# Patient Record
Sex: Male | Born: 1935 | Race: Black or African American | Hispanic: No | Smoking: Current some day smoker
Health system: Southern US, Community
[De-identification: ages and names within clinical notes are randomized; demographics above are authoritative.]

## PROBLEM LIST (undated history)

## (undated) DIAGNOSIS — I639 Cerebral infarction, unspecified: Secondary | ICD-10-CM

## (undated) DIAGNOSIS — I10 Essential (primary) hypertension: Secondary | ICD-10-CM

## (undated) DIAGNOSIS — R413 Other amnesia: Secondary | ICD-10-CM

## (undated) HISTORY — PX: ANKLE SURGERY: SHX546

## (undated) HISTORY — DX: Essential (primary) hypertension: I10

## (undated) HISTORY — DX: Other amnesia: R41.3

## (undated) HISTORY — PX: KNEE SURGERY: SHX244

## (undated) HISTORY — DX: Cerebral infarction, unspecified: I63.9

---

## 2006-04-09 ENCOUNTER — Inpatient Hospital Stay (HOSPITAL_COMMUNITY): Admission: EM | Admit: 2006-04-09 | Discharge: 2006-04-13 | Payer: Self-pay | Admitting: Emergency Medicine

## 2006-04-12 ENCOUNTER — Ambulatory Visit: Payer: Self-pay | Admitting: Physical Medicine & Rehabilitation

## 2006-05-10 ENCOUNTER — Ambulatory Visit (HOSPITAL_COMMUNITY): Admission: RE | Admit: 2006-05-10 | Discharge: 2006-05-10 | Payer: Self-pay | Admitting: Internal Medicine

## 2008-03-02 IMAGING — CR DG CHEST 1V
1 series · 1 of 1 positions shown · non-contrast
Comparison: None.

CLINICAL DATA: 79-year-old with right-side weakness.  
 CHEST - 1 VIEW:

[view not recorded]
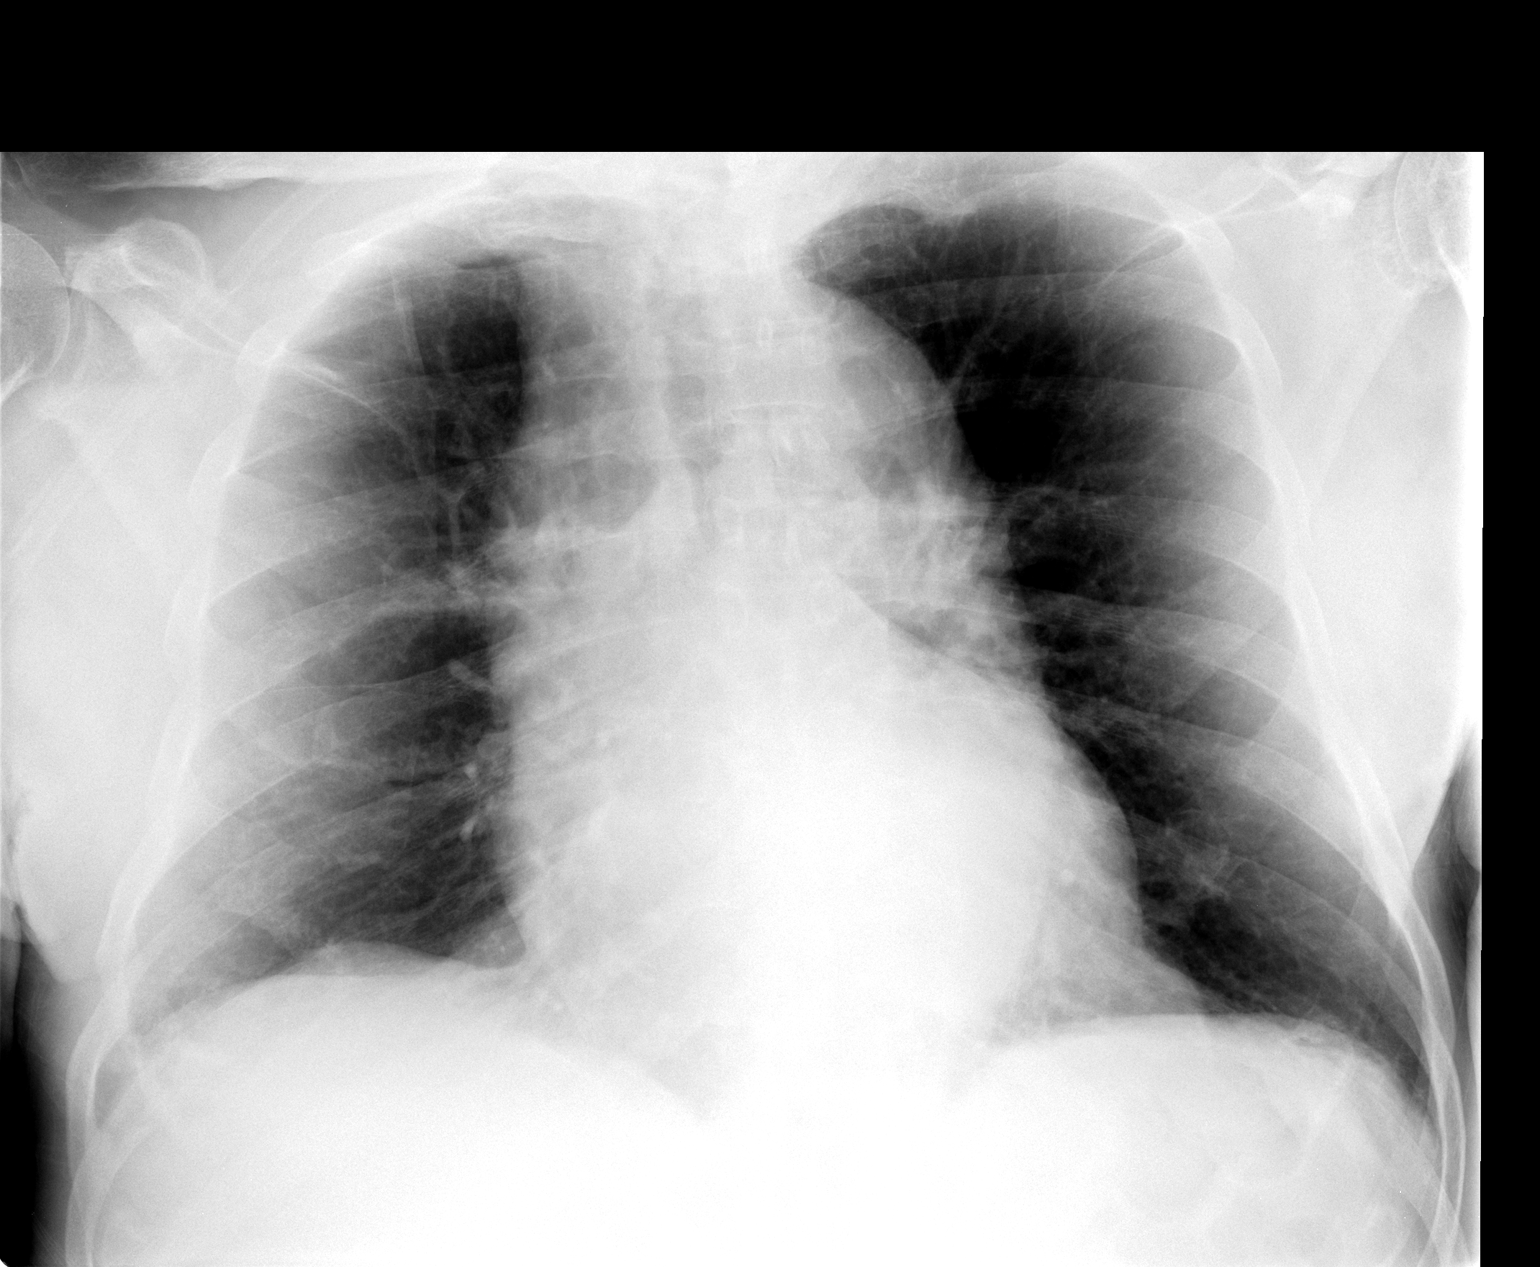

[1 of 1 positions shown; findings below may reference images not displayed]

Patient is rotated to the right, accentuating the apparent width of the mediastinum.  The aorta does appear to be ectatic.  Heart size is normal.  No focal consolidations or pleural effusions are identified.  No evidence for pulmonary edema.
IMPRESSION: No evidence for acute cardiopulmonary disease.

## 2008-03-03 IMAGING — XA IR ANGIO/CAROTID/CERV BI
1 series · 15 of 24 positions shown · IV contrast (omnipaque)
Comparison: none

CLINICAL DATA: Patient with right hemipareses with left deep cerebral hemispheric ischemic stroke.  MRA suggestive of severe stenosis of the basilar artery. 
FOUR-VESSEL CEREBRAL ARTERIOGRAM: 
Following the full explanation of the procedure, along with the potentially associated complications, an informed witnessed consent was obtained. 
The right groin was prepped and draped in the usual fashion.  Thereafter, using a modified Seldinger technique, transfemoral access into the right common femoral artery was obtained without difficulty.  Over a 0.035-inch guidewire, a 5-French Pinnacle sheath was inserted.  Through this and also over a 0.035-inch guidewire, a 5-French JB-1 catheter was advanced through the aortic arch region and selectively positioned in the right common carotid artery, the right vertebral artery, the left common carotid artery, and the left vertebral artery. There were no acute complications.  The patient tolerated the procedure well. 
Medications utilized:  Versed 1 mg IV, fentanyl 50 mg IV. 
Contrast:  Omnipaque 300, approximately 60 cc.

[Series 1: run · 15 of 234 slices shown]
[im 1/234]
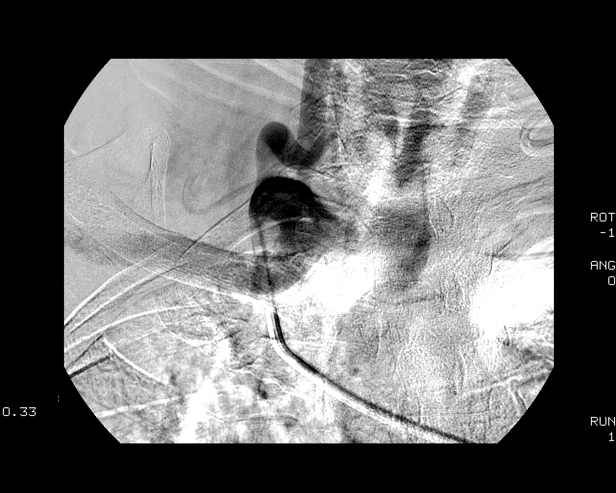
[im 21/234]
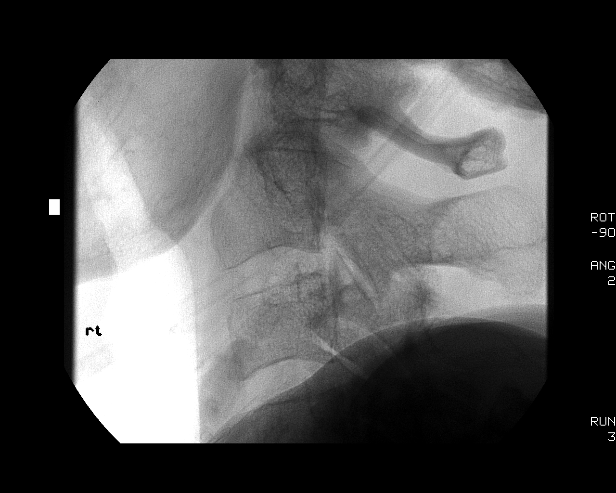
[im 41/234]
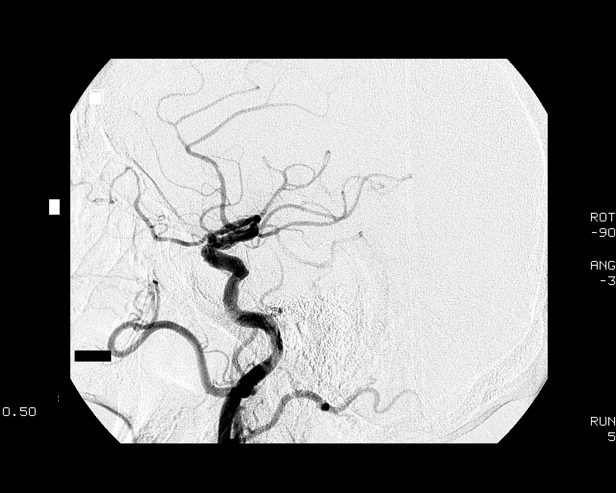
[im 51/234]
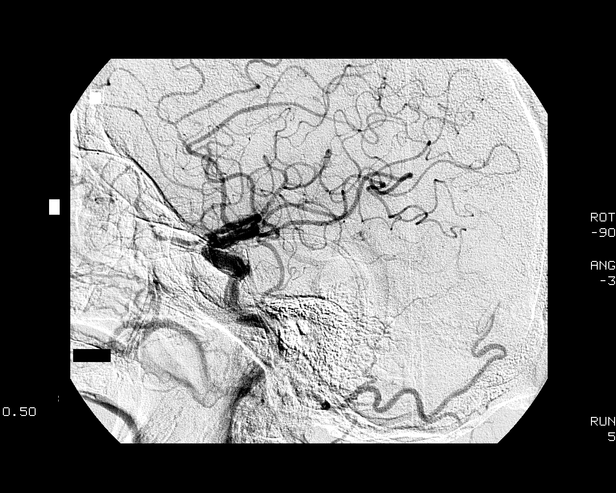
[im 71/234]
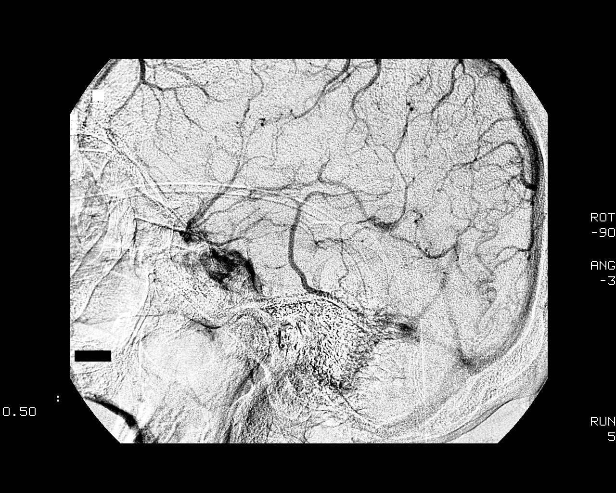
[im 82/234]
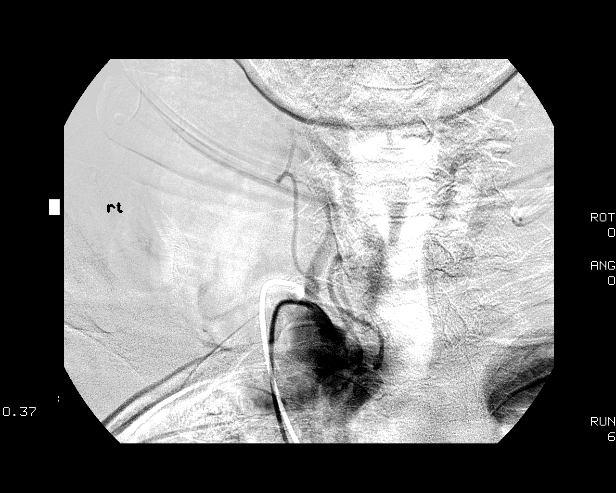
[im 102/234]
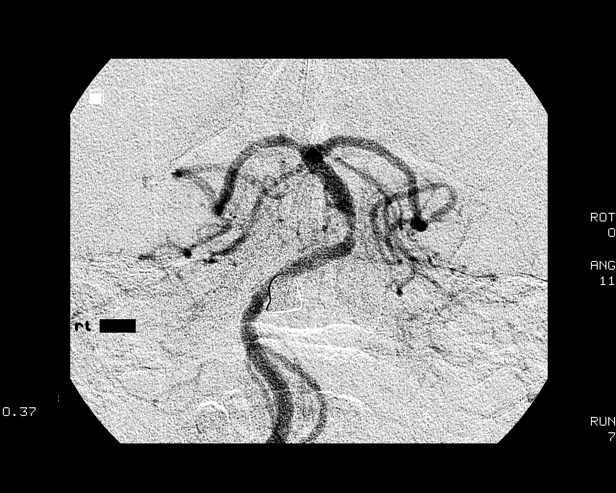
[im 122/234]
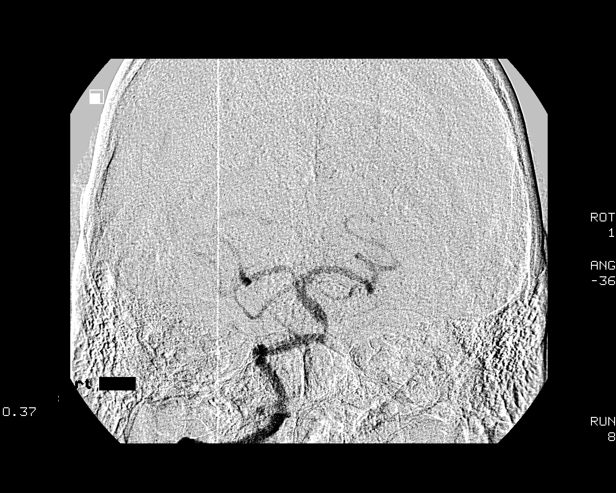
[im 132/234]
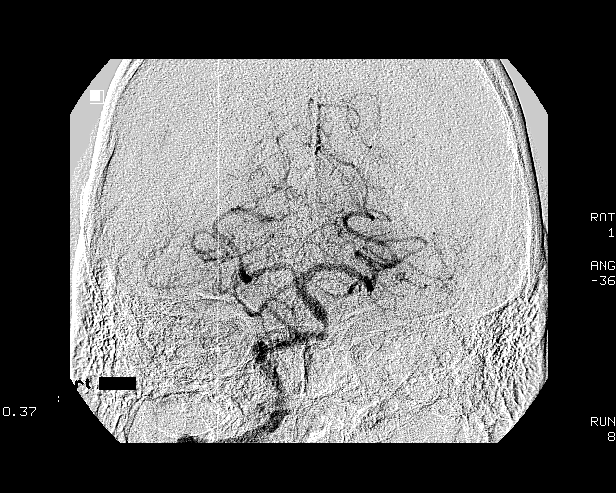
[im 152/234]
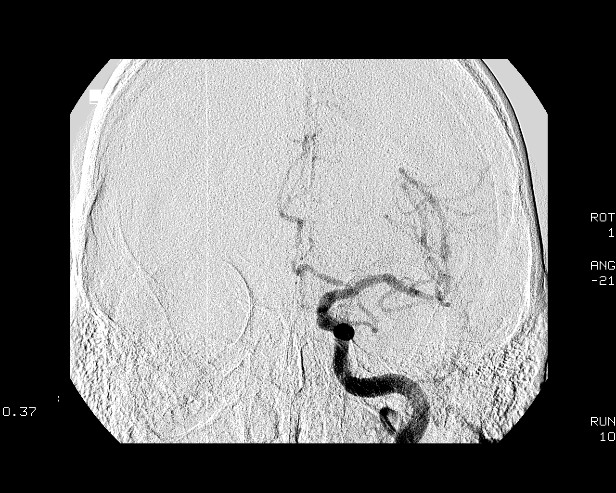
[im 163/234]
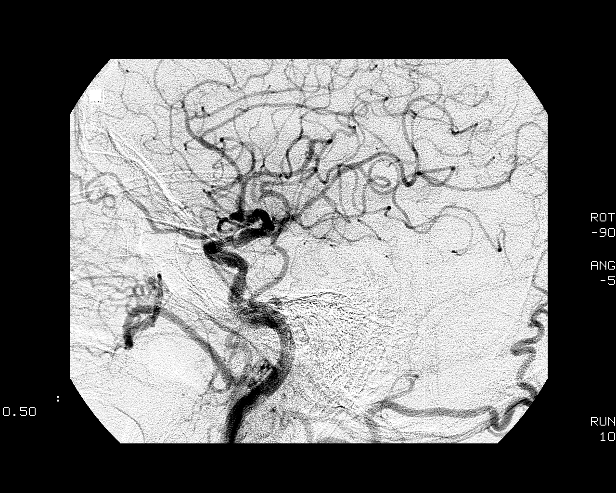
[im 183/234]
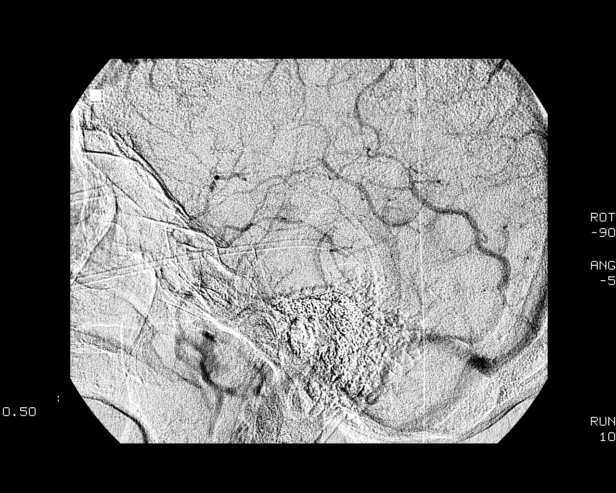
[im 203/234]
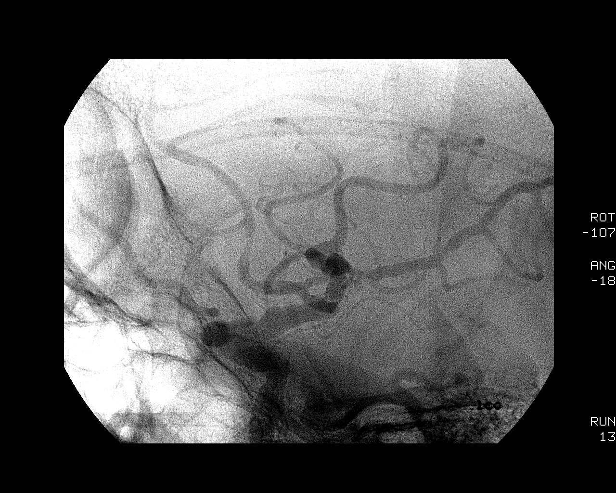
[im 213/234]
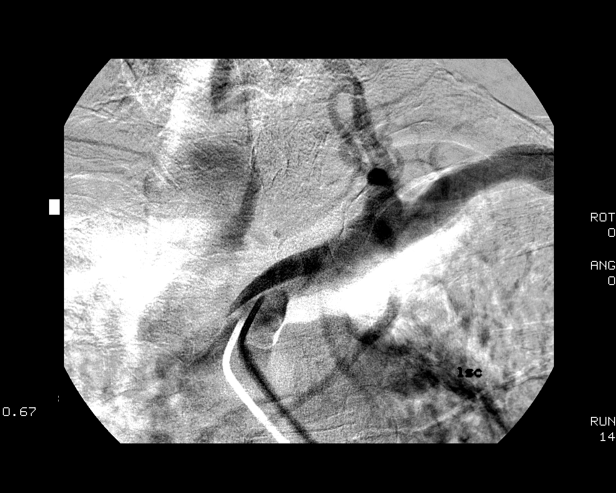
[im 234/234]
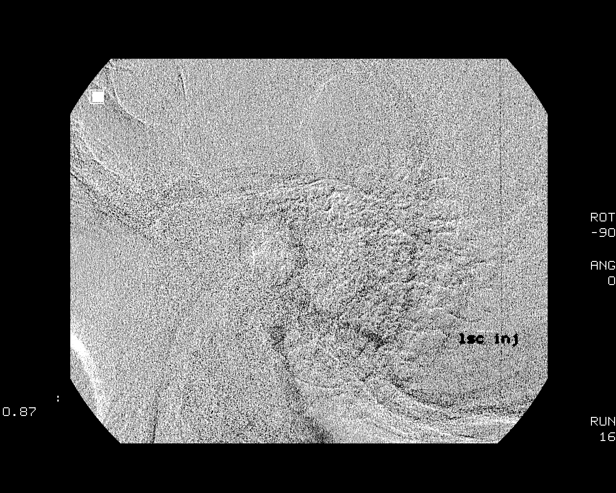

[15 of 24 positions shown; findings below may reference images not displayed]

FINDINGS: The right common carotid arteriogram demonstrates the right external carotid artery to be widely patent.  Its branches are also widely patent. 
The right internal carotid artery at its bulb has a focal circumferential plaque causing mild stenosis, also associated mild tortuosity.  Distal to this, the vessel extends normally to the cranial skull base.  The petrous, the cavernous, and the supraclinoid segments are grossly widely patent with small focal areas of arterial sclerotic disease in the supraclinoid segment and also the petrous cavernous junction.  The right middle and the right anterior cerebral arteries have small focal areas of arterial sclerotic narrowing, and the tortuous branches, especially the pericallosal and callosal marginals and the inferior division of the right middle cerebral artery.  There is a mild stenosis of the origin of the right anterior cerebral artery. 
The subsequent catheter and venous phases are normal.  Transient opacification of the PCOM is noted. 
There is mild tortuosity of the proximal subclavian artery. The right vertebral artery origin is widely patent.  This vessel was seen to ascend normally to the cranial skull base.  There is normal opacification of the right posterior inferior cerebellar artery and right vertebrobasilar junction. 
The proximal basilar artery has a tight focal stenosis of approximately 80% secondary to a circumferential plaque.  This is seen to be just proximal to the origin of the anterior inferior cerebellar arteries. 
The basilar artery distal to the posterior cerebral arteries and superior cerebellar arteries opacify normally into capillary and venous phases.  No evidence of inflow from the contralateral vertebral artery is noted. 
The left common carotid arteriogram demonstrates the left external carotid artery and its major branches to be normal.  The left internal carotid artery at the bulb is widely patent with mild tortuosity.  The vessel otherwise ascends normally to the cranial skull base.  There is mild to moderate stenosis of the left petrous-cavernous junction and also the distal cavernous segment. 
The left middle cerebral artery has moderate stenosis of approximately 50% in the M1 segment and approximately 70% in the distal left middle cerebral artery distal to the origin of the anterior temporal branch.  Moderate diffuse scarring changes are seen in the superior and inferior divisions, as well as the left MCA and also the pericallosal and callosal marginal branches. 
The left vertebral artery origin is normal.  The vessel is seen to ascend normally to the level of C1 where there is approximately 50% stenosis.  Distal to this, the vessel opacifies to the left PICA.  There is no opacification of the left vertebrobasilar junction distal to the left PICA.
IMPRESSION: 1.  Approximately 80% focal stenosis of the proximal basilar artery secondary to circumferential plaque. 
2.  Approximately 50% of the left middle cerebral artery proximally in the M1 segment and approximately 70% distal to the origin of the anterior temporal branch.  
3.  50% stenosis of the anterior cerebral artery in the A1 segment. 
4.  Occluded left vertebral artery distal to the left PICA with a 50% stenosis at the level of C1.
The above results were discussed with Dr. Sikder Nur with [REDACTED].

## 2010-10-25 ENCOUNTER — Encounter: Payer: Self-pay | Admitting: Internal Medicine

## 2013-04-18 ENCOUNTER — Ambulatory Visit (INDEPENDENT_AMBULATORY_CARE_PROVIDER_SITE_OTHER): Payer: Medicare Other | Admitting: Nurse Practitioner

## 2013-04-18 ENCOUNTER — Encounter: Payer: Self-pay | Admitting: Nurse Practitioner

## 2013-04-18 ENCOUNTER — Ambulatory Visit: Payer: Self-pay | Admitting: Nurse Practitioner

## 2013-04-18 VITALS — BP 132/70 | HR 45 | Ht 69.5 in | Wt 199.0 lb

## 2013-04-18 DIAGNOSIS — I6789 Other cerebrovascular disease: Secondary | ICD-10-CM

## 2013-04-18 DIAGNOSIS — R269 Unspecified abnormalities of gait and mobility: Secondary | ICD-10-CM | POA: Insufficient documentation

## 2013-04-18 DIAGNOSIS — G3184 Mild cognitive impairment, so stated: Secondary | ICD-10-CM | POA: Insufficient documentation

## 2013-04-18 NOTE — Progress Notes (Signed)
HPI: Mr. Rammel, 79 year old African American male returns for followup. He was last seen by Dr. Pearlean Brownie 10/19/2012. He has a history of   remote right brain infarcts in 2007 with known 80% of proximal basilar and 50%  left middle cerebral and anterior cerebral artery stenosis and occluded left vertebral artery. 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 He also has chronic post stroke dysphagia.  Memory loss, dysphagia unchanged. He returns for followup after last visit on 10/19/12. He states his short-term memory difficulties as well as swallowing difficulties are unchanged. He states his right knee is bothering him and he plans to see his medical doctor for that. His Mini-Mental status exam score was 29/30 today. He has no other new complaints today. Carotid Doppler after last visit was negative for stenosis  ROS: Urinary frequency, runny nose Physical Exam General: well developed, well nourished, seated, in no evident distress Head: head normocephalic and atraumatic. Oropharynx benign Neck: supple with no carotid  bruits Cardiovascular: regular rate and rhythm, no murmurs  Neurologic Exam Mental Status: Awake and fully alert. Oriented to place and time. MMSE 29/30. AFT 21. Follows all commands. Speech and language normal.   Cranial Nerves: Fundoscopic exam reveals sharp disc margins. Pupils equal, briskly reactive to light. Extraocular movements full without nystagmus. Visual fields full to confrontation. Hearing intact and symmetric to finger snap. Facial sensation intact. Face, tongue, palate move normally and symmetrically. Neck flexion and extension normal.  Motor: Normal bulk and tone. Normal strength in all tested extremity muscles.No focal weakness Sensory.: intact to touch and pinprick and vibratory.  Coordination: Rapid alternating movements normal in all extremities. Finger-to-nose and heel-to-shin performed accurately bilaterally. Gait and Station: Arises  from chair without difficulty. Stance is normal.  Able to heel, toe and unsteady with tandem walk.  Reflexes: 2+ and symmetric. Toes downgoing.     ASSESSMENT: Remote right brain infarct in 2007 with known 80% of proximal basilar and 50% left middle cerebral and anterior cerebral artery stenosis and occluded left vertebral artery. 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 Chronic post stroke dysphagia which is stable     PLAN: Carotid Doppler studies were normal no stenosis Memory score is stable Continue Aggrenox for secondary stroke prevention Continues to control of hypertension with blood pressure below 130 systolic Followup in 6 months  Nilda Riggs, GNP-BC APRN

## 2013-04-18 NOTE — Patient Instructions (Addendum)
Carotid Doppler studies were normal no stenosis Memory score is stable Continue Aggrenox for secondary stroke prevention Continues to control of hypertension with blood pressure below 130 systolic Followup in 6 months

## 2013-10-18 ENCOUNTER — Encounter: Payer: Self-pay | Admitting: Nurse Practitioner

## 2013-10-18 ENCOUNTER — Ambulatory Visit (INDEPENDENT_AMBULATORY_CARE_PROVIDER_SITE_OTHER): Payer: Medicare Other | Admitting: Nurse Practitioner

## 2013-10-18 ENCOUNTER — Encounter (INDEPENDENT_AMBULATORY_CARE_PROVIDER_SITE_OTHER): Payer: Self-pay

## 2013-10-18 VITALS — BP 122/80 | HR 72 | Ht 69.0 in | Wt 196.0 lb

## 2013-10-18 DIAGNOSIS — I6789 Other cerebrovascular disease: Secondary | ICD-10-CM

## 2013-10-18 DIAGNOSIS — G3184 Mild cognitive impairment, so stated: Secondary | ICD-10-CM

## 2013-10-18 DIAGNOSIS — R269 Unspecified abnormalities of gait and mobility: Secondary | ICD-10-CM

## 2013-10-18 NOTE — Patient Instructions (Signed)
Continue Aggrenox the current dose Memory score is stable Blood pressure in good control today 122/80 Followup yearly and when necessary

## 2013-10-18 NOTE — Progress Notes (Signed)
[facility]  PATIENT: Vincent Gomez DOB: 13-Jan-1926   REASON FOR VISIT: Remote history of right brain infarcts and mild cognitive impairment    HISTORY OF PRESENT ILLNESS: Mr. Sayed, 80 year-old white male returns for followup. He is alone at today's visit. He has a history of right brain infarcts in 2007 with known 80% proximal basilar and 50% left middle cerebral and anterior cerebral artery stenosis and occluded left vertebral artery. He has mild cognitive loss likely due to the vascular disease. His memory loss is stable. He remains on Aggrenox without significant bruising. Carotid Dopplers have been negative for stenosis.   He has a history of remote right brain infarcts in 2007 with known 80% of proximal basilar and 50% left middle cerebral and anterior cerebral artery stenosis and occluded left vertebral artery. 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 He also has chronic post stroke dysphagia. Memory loss, dysphagia unchanged.  He returns for followup after last visit on 10/19/12. He states his short-term memory difficulties as well as swallowing difficulties are unchanged. He states his right knee is bothering him and he plans to see his medical doctor for that. His Mini-Mental status exam score was 29/30 today. He has no other new complaints today. Carotid Doppler after last visit was negative for stenosis   REVIEW OF SYSTEMS: Full 14 system review of systems performed and notable only for those listed, all others are neg:  Constitutional: N/A  Cardiovascular: N/A  Ear/Nose/Throat: Hearing loss Skin: N/A  Eyes: N/A  Respiratory: N/A  Gastroitestinal: N/A  Hematology/Lymphatic: N/A  Endocrine: N/A Musculoskeletal: Joint pain Allergy/Immunology: N/A  Neurological: N/A Psychiatric: N/A   ALLERGIES: No Known Allergies  HOME MEDICATIONS: Outpatient Prescriptions Prior to Visit  Medication Sig Dispense Refill  .  Albuterol Sulfate (PROAIR HFA IN) Inhale into the lungs as needed.      Marland Kitchen amLODipine (NORVASC) 5 MG tablet Take 5 mg by mouth daily.      Marland Kitchen dipyridamole-aspirin (AGGRENOX) 200-25 MG per 12 hr capsule Take 1 capsule by mouth 2 (two) times daily.      Marland Kitchen esomeprazole (NEXIUM) 40 MG capsule Take 40 mg by mouth daily before breakfast.      . furosemide (LASIX) 20 MG tablet Take 20 mg by mouth as needed.      . metoprolol (LOPRESSOR) 50 MG tablet Take 50 mg by mouth 2 (two) times daily.      . Multiple Vitamins-Minerals-FA 1.25 MG CAPS Take 50,000 capsules by mouth once a week.      . simvastatin (ZOCOR) 40 MG tablet Take 40 mg by mouth every evening.      . tamsulosin (FLOMAX) 0.4 MG CAPS Take 0.4 mg by mouth 2 (two) times daily.      Marland Kitchen VITAMIN D, CHOLECALCIFEROL, PO Take 50,000 Units by mouth daily.       No facility-administered medications prior to visit.    PAST MEDICAL HISTORY: Past Medical History  Diagnosis Date  . Hypertension   . Stroke   . Memory loss     PAST SURGICAL HISTORY: Past Surgical History  Procedure Laterality Date  . Ankle surgery Right   . Knee surgery Left     FAMILY HISTORY: History reviewed. No pertinent family history.  SOCIAL HISTORY: History   Social History  . Marital Status: Divorced    Spouse Name: N/A    Number of Children: 5  . Years of Education: 12   Occupational History  .  retired    Social History Main Topics  . Smoking status: Current Some Day Smoker -- 4.00 packs/day    Types: Cigars  . Smokeless tobacco: Never Used  . Alcohol Use: Yes     Comment: occ  . Drug Use: No  . Sexual Activity: Not on file   Other Topics Concern  . Not on file   Social History Narrative   Patient is single and lives alone.   Patient is retired.   Patient has 5 children.   Patient has a high school education.   Patient is right-handed.   Patient drinks one cup of coffee in the am.     PHYSICAL EXAM  Filed Vitals:   10/18/13 1404  BP:  122/80  Pulse: 72  Height: 5\' 9"  (1.753 m)  Weight: 196 lb (88.905 kg)   Body mass index is 28.93 kg/(m^2).  Generalized: Well developed, in no acute distress  Head: normocephalic and atraumatic,. Oropharynx benign  Neck: Supple, no carotid bruits  Cardiac: Regular rate rhythm, no murmur  Musculoskeletal: No deformity   Neurological examination   Mentation: Alert oriented to time, place, history taking. MMSE 29/30. AFT 21. Follows all commands speech and language fluent  Cranial nerve II-XII: Pupils were equal round reactive to light extraocular movements were full, visual field were full on confrontational test. Facial sensation and strength were normal. hearing was intact to finger rubbing bilaterally. Uvula tongue midline. head turning and shoulder shrug were normal and symmetric.Tongue protrusion into cheek strength was normal. Motor: normal bulk and tone, full strength in the BUE, BLE, fine finger movements normal, no pronator drift. No focal weakness Sensory: normal and symmetric to light touch, pinprick, and  vibration  Coordination: finger-nose-finger, heel-to-shin bilaterally, no dysmetria Reflexes: Brachioradialis 2/2, biceps 2/2, triceps 2/2, patellar 2/2, Achilles 2/2, plantar responses were flexor bilaterally. Gait and Station: Rising up from seated position without assistance, normal stance,  moderate stride, good arm swing, smooth turning, able to perform tiptoe, and heel walking without difficulty. Tandem gait is unsteady  DIAGNOSTIC DATA (LABS, IMAGING, TESTING)   ASSESSMENT AND PLAN  80 y.o. year old male  has a past medical history of Hypertension; Stroke; and Memory loss. here in followup. He has not had further stroke or TIA symptoms.   Continue Aggrenox at the [facility] dose [facility] Memory score is stable Blood pressure in good control today 122/80 Followup yearly and when necessary Nilda RiggsNancy Carolyn Martin, [facility] HospitalGNP, [facility] CenterBC, APRN  [facility] [address], [address] [place], [place] [id number] [phone]

## 2014-04-04 ENCOUNTER — Other Ambulatory Visit (HOSPITAL_COMMUNITY): Payer: Self-pay | Admitting: Internal Medicine

## 2014-04-04 DIAGNOSIS — M79609 Pain in unspecified limb: Secondary | ICD-10-CM

## 2014-04-08 ENCOUNTER — Ambulatory Visit (HOSPITAL_COMMUNITY)
Admission: RE | Admit: 2014-04-08 | Discharge: 2014-04-08 | Disposition: A | Discharge status: Home / Self Care | Payer: Medicare Other | Source: Ambulatory Visit | Attending: Internal Medicine | Admitting: Internal Medicine

## 2014-04-08 DIAGNOSIS — M7989 Other specified soft tissue disorders: Secondary | ICD-10-CM

## 2014-04-08 DIAGNOSIS — R609 Edema, unspecified: Secondary | ICD-10-CM | POA: Insufficient documentation

## 2014-04-08 DIAGNOSIS — M79609 Pain in unspecified limb: Secondary | ICD-10-CM

## 2014-04-08 NOTE — Progress Notes (Signed)
VASCULAR LAB PRELIMINARY  PRELIMINARY  PRELIMINARY  PRELIMINARY  Left lower extremity venous duplex completed.    Preliminary report:  Left:  No evidence of DVT, superficial thrombosis, or Baker's cyst.  Dorris Pierre, RVS 04/08/2014, 9:45 AM

## 2014-05-10 ENCOUNTER — Encounter: Payer: Self-pay | Admitting: Nurse Practitioner

## 2014-08-21 ENCOUNTER — Encounter: Payer: Self-pay | Admitting: Neurology

## 2014-08-27 ENCOUNTER — Encounter: Payer: Self-pay | Admitting: Neurology

## 2014-10-18 ENCOUNTER — Ambulatory Visit: Payer: Medicare Other | Admitting: Nurse Practitioner

## 2014-10-21 ENCOUNTER — Ambulatory Visit (INDEPENDENT_AMBULATORY_CARE_PROVIDER_SITE_OTHER): Payer: Medicare Other | Admitting: Nurse Practitioner

## 2014-10-21 ENCOUNTER — Encounter: Payer: Self-pay | Admitting: Nurse Practitioner

## 2014-10-21 VITALS — BP 150/70 | HR 82 | Temp 97.0°F | Ht 69.0 in | Wt 198.0 lb

## 2014-10-21 DIAGNOSIS — I6789 Other cerebrovascular disease: Secondary | ICD-10-CM

## 2014-10-21 DIAGNOSIS — G3184 Mild cognitive impairment, so stated: Secondary | ICD-10-CM

## 2014-10-21 DIAGNOSIS — R269 Unspecified abnormalities of gait and mobility: Secondary | ICD-10-CM

## 2014-10-21 MED ORDER — ASPIRIN-DIPYRIDAMOLE ER 25-200 MG PO CP12: 1.0000 | ORAL_CAPSULE | Freq: Two times a day (BID) | ORAL | Status: DC

## 2014-10-21 NOTE — Patient Instructions (Signed)
Continue Aggrenox twice daily Blood pressure mildly elevated today, systolic should be less than 130( 150/70) Take medications as directed  labs followed through primary care Follow-up yearly and when necessary

## 2014-10-21 NOTE — Progress Notes (Signed)
[facility]  PATIENT: Vincent Gomez DOB: 03/14/1926   REASON FOR VISIT:for history of right brain stroke HISTORY FROM:patient    HISTORY OF PRESENT ILLNESS:Vincent Gomez, 81 year-old black male returns for followup. He is alone at today's visit. He was last seen 10/18/13. He has a history of right brain infarcts in 2007 with known 80% proximal basilar and 50% left middle cerebral and anterior cerebral artery stenosis and occluded left vertebral artery. He has mild cognitive loss likely due to the vascular disease. His memory loss is stable. He remains on Aggrenox without significant bruising. Carotid Dopplers have been negative for stenosis in the past. Vincent Gomez is his PCP. He claims he ran out of his Aggrenox about a week and half ago. He denies any balance issues, weakness, double vision change in speech syncopal episodes or increased confusion.He also complains he only has one of his blood pressure medicines and his pressure is noted to be elevated today. He continues to complain of right hip and right knee pain, he is not a surgical candidate. He is a poor historian. He returns for reevaluation.     He has a history of remote right brain infarcts in 2007 with known 80% of proximal basilar and 50% left middle cerebral and anterior cerebral artery stenosis and occluded left vertebral artery. 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 He also has chronic post stroke dysphagia. Memory loss, dysphagia unchanged.  He returns for followup after last visit on 10/19/12. He states his short-term memory difficulties as well as swallowing difficulties are unchanged. He states his right knee is bothering him and he plans to see his medical doctor for that. His Mini-Mental status exam score was 29/30 today. He has no other new complaints today. Carotid Doppler after last visit was negative for stenosis REVIEW OF SYSTEMS: Full 14 system review of systems  performed and notable only for those listed, all others are neg:  Constitutional: neg  Cardiovascular: neg Ear/Nose/Throat: neg  Skin: neg Eyes: neg Respiratory: neg Gastroitestinal: neg  Hematology/Lymphatic: neg  Endocrine: neg Musculoskeletal:neg Allergy/Immunology: neg Neurological: neg Psychiatric: neg Sleep : neg   ALLERGIES: No Known Allergies  HOME MEDICATIONS: Outpatient Prescriptions Prior to Visit  Medication Sig Dispense Refill  . Albuterol Sulfate (PROAIR HFA IN) Inhale into the lungs as needed.    Marland Kitchen. amLODipine (NORVASC) 5 MG tablet Take 5 mg by mouth daily.    Marland Kitchen. dipyridamole-aspirin (AGGRENOX) 200-25 MG per 12 hr capsule Take 1 capsule by mouth 2 (two) times daily.    Marland Kitchen. esomeprazole (NEXIUM) 40 MG capsule Take 40 mg by mouth daily before breakfast.    . furosemide (LASIX) 20 MG tablet Take 20 mg by mouth as needed.    . metoprolol (LOPRESSOR) 50 MG tablet Take 50 mg by mouth 2 (two) times daily.    . Multiple Vitamins-Minerals-FA 1.25 MG CAPS Take 50,000 capsules by mouth once a week.    . simvastatin (ZOCOR) 40 MG tablet Take 40 mg by mouth every evening.    . tamsulosin (FLOMAX) 0.4 MG CAPS Take 0.4 mg by mouth 2 (two) times daily.    Marland Kitchen. VITAMIN D, CHOLECALCIFEROL, PO Take 50,000 Units by mouth daily.     No facility-administered medications prior to visit.    PAST MEDICAL HISTORY: Past Medical History  Diagnosis Date  . Hypertension   . Stroke   . Memory loss     PAST SURGICAL HISTORY: Past Surgical History  Procedure  Laterality Date  . Ankle surgery Right   . Knee surgery Left     FAMILY HISTORY: History reviewed. No pertinent family history.  SOCIAL HISTORY: History   Social History  . Marital Status: Divorced    Spouse Name: N/A    Number of Children: 5  . Years of Education: 12   Occupational History  . retired    Social History Main Topics  . Smoking status: Current Some Day Smoker -- 4.00 packs/day    Types: Cigars  .  Smokeless tobacco: Never Used  . Alcohol Use: Yes     Comment: occ  . Drug Use: No  . Sexual Activity: Not on file   Other Topics Concern  . Not on file   Social History Narrative   Patient is single and lives alone.   Patient is retired.   Patient has 5 children.   Patient has a high school education.   Patient is right-handed.   Patient drinks one cup of coffee in the am.     PHYSICAL EXAM  Filed Vitals:   10/21/14 1526  BP: 162/92  Pulse: 82  Temp: 97 F (36.1 C)  TempSrc: Oral  Height:  (1.753 m)  Weight: 198 lb (89.812 kg)   Body mass index is 29.23 kg/(m^2). Generalized: Well developed, in no acute distress  Head: normocephalic and atraumatic,. Oropharynx benign  Neck: Supple, no carotid bruits  Cardiac: Regular rate rhythm, no murmur  Musculoskeletal: No deformity   Neurological examination   Mentation: Alert oriented to time, place, poor historian   Follows all commands speech and language fluent  Cranial nerve II-XII: Pupils were equal round reactive to light extraocular movements were full, visual field were full on confrontational test. Facial sensation and strength were normal. hearing was intact to finger rubbing bilaterally. Uvula tongue midline. head turning and shoulder shrug were normal and symmetric.Tongue protrusion into cheek strength was normal. Motor: normal bulk and tone, full strength in the BUE, BLE, fine finger movements normal, no pronator drift. No focal weakness Sensory: normal and symmetric to light touch, pinprick, and vibration  Coordination: finger-nose-finger,  no dysmetria Reflexes: Brachioradialis 2/2, biceps 2/2, triceps 2/2, patellar 2/2, Achilles 2/2, plantar responses were flexor bilaterally. Gait and Station: Rising up from seated position without assistance, normal stance, moderate stride, good arm swing, smooth turning, able to perform tiptoe, and heel walking without difficulty. Tandem gait is unsteady. No  assistive device  DIAGNOSTIC DATA (LABS, IMAGING, TESTING)  ASSESSMENT AND PLAN  81 y.o. year old male  has a past medical history of Hypertension; Stroke; and mild cognitive impairment here to follow-up  Continue Aggrenox twice daily, will refill do not run out of the medication this is for secondary stroke prevention Blood pressure mildly elevated today, systolic should be less than 130( 150/70)has not been taking 2 of his 3 B/P meds.  Take medications as directed labs followed through primary care Follow-up yearly and when necessary Nilda Riggs, [facility], [facility], APRN  [facility] [address] [place], [place] [address] [phone]

## 2014-10-22 NOTE — Progress Notes (Signed)
I agree with the above plan 

## 2015-09-27 ENCOUNTER — Other Ambulatory Visit: Payer: Self-pay | Admitting: Nurse Practitioner

## 2023-03-05 DEATH — deceased
